# Patient Record
Sex: Female | Born: 1975 | Race: White | Hispanic: No | Marital: Single | Smoking: Never smoker
Health system: Southern US, Community
[De-identification: ages and names within clinical notes are randomized; demographics above are authoritative.]

## PROBLEM LIST (undated history)

## (undated) DIAGNOSIS — R51 Headache: Secondary | ICD-10-CM

## (undated) DIAGNOSIS — J45909 Unspecified asthma, uncomplicated: Secondary | ICD-10-CM

## (undated) DIAGNOSIS — J329 Chronic sinusitis, unspecified: Secondary | ICD-10-CM

## (undated) DIAGNOSIS — R519 Headache, unspecified: Secondary | ICD-10-CM

## (undated) HISTORY — PX: SINUS ENDO W/FUSION: SHX777

## (undated) HISTORY — DX: Unspecified asthma, uncomplicated: J45.909

## (undated) HISTORY — PX: REDUCTION MAMMAPLASTY: SUR839

---

## 2011-02-17 ENCOUNTER — Other Ambulatory Visit (HOSPITAL_COMMUNITY)
Admission: RE | Admit: 2011-02-17 | Discharge: 2011-02-17 | Disposition: A | Discharge status: Home / Self Care | Payer: Federal, State, Local not specified - PPO | Source: Ambulatory Visit | Attending: Family Medicine | Admitting: Family Medicine

## 2011-02-17 DIAGNOSIS — Z1159 Encounter for screening for other viral diseases: Secondary | ICD-10-CM | POA: Insufficient documentation

## 2011-02-17 DIAGNOSIS — Z124 Encounter for screening for malignant neoplasm of cervix: Secondary | ICD-10-CM | POA: Insufficient documentation

## 2013-08-27 ENCOUNTER — Ambulatory Visit (INDEPENDENT_AMBULATORY_CARE_PROVIDER_SITE_OTHER): Payer: Federal, State, Local not specified - PPO | Admitting: Podiatry

## 2013-08-27 ENCOUNTER — Encounter: Payer: Self-pay | Admitting: Podiatry

## 2013-08-27 VITALS — BP 123/77 | HR 72 | Resp 18

## 2013-08-27 DIAGNOSIS — L600 Ingrowing nail: Secondary | ICD-10-CM

## 2013-08-27 NOTE — Progress Notes (Signed)
   Subjective:    Patient ID: Daisy Steele, female    DOB: 09/08/1976, 36 y.o.   MRN: [id number]  HPI I have an ingrown toenail on my right big toe and throbbing and was swollen and red and I did pick at it and soaked it in peroxide and tender and sore and been going on for about 3 days before thanksgiving and the left big toenail is discolored and maybe ingrown as well  Patient recalls previous nail surgery to this area.  Review of Systems  Constitutional: Negative.   HENT: Positive for sinus pressure.   Eyes: Positive for itching.  Respiratory: Positive for wheezing.        Difficulty breathing  Cardiovascular: Negative.   Gastrointestinal: Negative.   Endocrine: Negative.   Genitourinary: Negative.   Musculoskeletal: Negative.   Skin:       Thick scars  Allergic/Immunologic: Positive for environmental allergies.  Neurological: Positive for numbness and headaches.  Hematological: Negative.   Psychiatric/Behavioral: Negative.        Objective:   Physical Exam  37 year old white female orientated x3  Vascular: The DP and PT pulses are two over four bilaterally.  Neurological: Sensation intact bilaterally  Dermatological: The lateral margin of the right hallux toenail is incurvated with low-grade erythema, edema with no drainage noted.         Assessment & Plan:   Assessment: Ingrowing lateral margin of the right hallux toenail.  Plan: Discussed treatment options including nonsurgical and surgical patient verbally consents to permanent nail surgery. The right hallux was then blocked with 3 cc of 50-50 mixture of 2% plain Xylocaine and 0.5% plain Marcaine. The right hallux is prepped with Betadine and exsanguinated. The lateral margin of the right hallux toenail was excised and a phenol matricectomy was performed. An antibiotic compression dressing was applied. The tourniquet was released and spontaneous capillary fill was noted to the right hallux. Postoperative oral and  written instructions provided. Acetaminophen recommended for pain control if needed. Reappoint at patient's request.

## 2013-08-27 NOTE — Patient Instructions (Signed)

## 2014-08-05 ENCOUNTER — Encounter: Payer: Self-pay | Admitting: Podiatry

## 2014-08-05 ENCOUNTER — Ambulatory Visit (INDEPENDENT_AMBULATORY_CARE_PROVIDER_SITE_OTHER): Payer: Federal, State, Local not specified - PPO | Admitting: Podiatry

## 2014-08-05 VITALS — BP 118/76 | HR 69 | Temp 97.9°F | Resp 13 | Ht 66.0 in | Wt 160.0 lb

## 2014-08-05 DIAGNOSIS — L600 Ingrowing nail: Secondary | ICD-10-CM

## 2014-08-05 NOTE — Patient Instructions (Signed)

## 2014-08-05 NOTE — Progress Notes (Signed)
   Subjective:    Patient ID: Daisy Steele, female    DOB: 04/27/1976, 38 y.o.   MRN: [id number]  HPI Comments: N ingrown toenail L left 1st lateral toenail D 5 days O C painful and red, and hx of bleeding A enclosed shoes and pressure T home surgery and cleanse with H2O2   She had a similar problem on the right hallux toenail which would resolve with phenol matricectomy.   Review of Systems  All other systems reviewed and are negative.      Objective:   Physical Exam Orientated 3  Vascular: DP and PT pulses 2/4 bilaterally  Neurological: Deferred  Dermatological: Lateral margin of left hallux toenail is incurvated with low-grade erythema and edema without drainage.  Musculoskeletal: HAV deformity left       Assessment & Plan:   Assessment: Satisfactory neurovascular status Ingrowing lateral margin of the left hallux toenail  Plan: Offered patient permanent nail removal to lateral margin left hallux toenail and she verbally consents.  The left hallux was blocked with 3 mL of 50-50 mixture of 2% plain Xylocaine and 0.5% plain Marcaine. The toe is prepped with Betadine and exsanguinated. The lateral margin the left hallux toenail was excised and a phenol matricectomy performed. An antibiotic compression dressing was applied. The tourniquet was released and spontaneous capillary fill time noted in the left hallux.  Patient tolerated procedure without any difficulty Postoperative oral reconstruction provided  Reappoint at patient's request

## 2015-04-23 ENCOUNTER — Encounter: Payer: Self-pay | Admitting: Podiatry

## 2015-04-23 ENCOUNTER — Ambulatory Visit (INDEPENDENT_AMBULATORY_CARE_PROVIDER_SITE_OTHER): Payer: Federal, State, Local not specified - PPO | Admitting: Podiatry

## 2015-04-23 VITALS — BP 105/62 | HR 78 | Resp 15

## 2015-04-23 DIAGNOSIS — M722 Plantar fascial fibromatosis: Secondary | ICD-10-CM

## 2015-04-23 NOTE — Patient Instructions (Signed)
Plantar Fasciitis (Heel Spur Syndrome) with Rehab The plantar fascia is a fibrous, ligament-like, soft-tissue structure that spans the bottom of the foot. Plantar fasciitis is a condition that causes pain in the foot due to inflammation of the tissue. SYMPTOMS   Pain and tenderness on the underneath side of the foot.  Pain that worsens with standing or walking. CAUSES  Plantar fasciitis is caused by irritation and injury to the plantar fascia on the underneath side of the foot. Common mechanisms of injury include:  Direct trauma to bottom of the foot.  Damage to a small nerve that runs under the foot where the main fascia attaches to the heel bone.  Stress placed on the plantar fascia due to bone spurs. RISK INCREASES WITH:   Activities that place stress on the plantar fascia (running, jumping, pivoting, or cutting).  Poor strength and flexibility.  Improperly fitted shoes.  Tight calf muscles.  Flat feet.  Failure to warm-up properly before activity.  Obesity. PREVENTION  Warm up and stretch properly before activity.  Allow for adequate recovery between workouts.  Maintain physical fitness:  Strength, flexibility, and endurance.  Cardiovascular fitness.  Maintain a health body weight.  Avoid stress on the plantar fascia.  Wear properly fitted shoes, including arch supports for individuals who have flat feet. PROGNOSIS  If treated properly, then the symptoms of plantar fasciitis usually resolve without surgery. However, occasionally surgery is necessary. RELATED COMPLICATIONS   Recurrent symptoms that may result in a chronic condition.  Problems of the lower back that are caused by compensating for the injury, such as limping.  Pain or weakness of the foot during push-off following surgery.  Chronic inflammation, scarring, and partial or complete fascia tear, occurring more often from repeated injections. TREATMENT  Treatment initially involves the use of  ice and medication to help reduce pain and inflammation. The use of strengthening and stretching exercises may help reduce pain with activity, especially stretches of the Achilles tendon. These exercises may be performed at home or with a therapist. Your caregiver may recommend that you use heel cups of arch supports to help reduce stress on the plantar fascia. Occasionally, corticosteroid injections are given to reduce inflammation. If symptoms persist for greater than 6 months despite non-surgical (conservative), then surgery may be recommended.  MEDICATION   If pain medication is necessary, then nonsteroidal anti-inflammatory medications, such as aspirin and ibuprofen, or other minor pain relievers, such as acetaminophen, are often recommended.  Do not take pain medication within 7 days before surgery.  Prescription pain relievers may be given if deemed necessary by your caregiver. Use only as directed and only as much as you need.  Corticosteroid injections may be given by your caregiver. These injections should be reserved for the most serious cases, because they may only be given a certain number of times. HEAT AND COLD  Cold treatment (icing) relieves pain and reduces inflammation. Cold treatment should be applied for 10 to 15 minutes every 2 to 3 hours for inflammation and pain and immediately after any activity that aggravates your symptoms. Use ice packs or massage the area with a piece of ice (ice massage).  Heat treatment may be used prior to performing the stretching and strengthening activities prescribed by your caregiver, physical therapist, or athletic trainer. Use a heat pack or soak the injury in warm water. SEEK IMMEDIATE MEDICAL CARE IF:  Treatment seems to offer no benefit, or the condition worsens.  Any medications produce adverse side effects. EXERCISES RANGE   OF MOTION (ROM) AND STRETCHING EXERCISES - Plantar Fasciitis (Heel Spur Syndrome) These exercises may help you  when beginning to rehabilitate your injury. Your symptoms may resolve with or without further involvement from your physician, physical therapist or athletic trainer. While completing these exercises, remember:   Restoring tissue flexibility helps normal motion to return to the joints. This allows healthier, less painful movement and activity.  An effective stretch should be held for at least 30 seconds.  A stretch should never be painful. You should only feel a gentle lengthening or release in the stretched tissue. RANGE OF MOTION - Toe Extension, Flexion  Sit with your right / left leg crossed over your opposite knee.  Grasp your toes and gently pull them back toward the top of your foot. You should feel a stretch on the bottom of your toes and/or foot.  Hold this stretch for __________ seconds.  Now, gently pull your toes toward the bottom of your foot. You should feel a stretch on the top of your toes and or foot.  Hold this stretch for __________ seconds. Repeat __________ times. Complete this stretch __________ times per day.  RANGE OF MOTION - Ankle Dorsiflexion, Active Assisted  Remove shoes and sit on a chair that is preferably not on a carpeted surface.  Place right / left foot under knee. Extend your opposite leg for support.  Keeping your heel down, slide your right / left foot back toward the chair until you feel a stretch at your ankle or calf. If you do not feel a stretch, slide your bottom forward to the edge of the chair, while still keeping your heel down.  Hold this stretch for __________ seconds. Repeat __________ times. Complete this stretch __________ times per day.  STRETCH - Gastroc, Standing  Place hands on wall.  Extend right / left leg, keeping the front knee somewhat bent.  Slightly point your toes inward on your back foot.  Keeping your right / left heel on the floor and your knee straight, shift your weight toward the wall, not allowing your back to  arch.  You should feel a gentle stretch in the right / left calf. Hold this position for __________ seconds. Repeat __________ times. Complete this stretch __________ times per day. STRETCH - Soleus, Standing  Place hands on wall.  Extend right / left leg, keeping the other knee somewhat bent.  Slightly point your toes inward on your back foot.  Keep your right / left heel on the floor, bend your back knee, and slightly shift your weight over the back leg so that you feel a gentle stretch deep in your back calf.  Hold this position for __________ seconds. Repeat __________ times. Complete this stretch __________ times per day. STRETCH - Gastrocsoleus, Standing  Note: This exercise can place a lot of stress on your foot and ankle. Please complete this exercise only if specifically instructed by your caregiver.   Place the ball of your right / left foot on a step, keeping your other foot firmly on the same step.  Hold on to the wall or a rail for balance.  Slowly lift your other foot, allowing your body weight to press your heel down over the edge of the step.  You should feel a stretch in your right / left calf.  Hold this position for __________ seconds.  Repeat this exercise with a slight bend in your right / left knee. Repeat __________ times. Complete this stretch __________ times per day.    STRENGTHENING EXERCISES - Plantar Fasciitis (Heel Spur Syndrome)  These exercises may help you when beginning to rehabilitate your injury. They may resolve your symptoms with or without further involvement from your physician, physical therapist or athletic trainer. While completing these exercises, remember:   Muscles can gain both the endurance and the strength needed for everyday activities through controlled exercises.  Complete these exercises as instructed by your physician, physical therapist or athletic trainer. Progress the resistance and repetitions only as guided. STRENGTH -  Towel Curls  Sit in a chair positioned on a non-carpeted surface.  Place your foot on a towel, keeping your heel on the floor.  Pull the towel toward your heel by only curling your toes. Keep your heel on the floor.  If instructed by your physician, physical therapist or athletic trainer, add ____________________ at the end of the towel. Repeat __________ times. Complete this exercise __________ times per day. STRENGTH - Ankle Inversion  Secure one end of a rubber exercise band/tubing to a fixed object (table, pole). Loop the other end around your foot just before your toes.  Place your fists between your knees. This will focus your strengthening at your ankle.  Slowly, pull your big toe up and in, making sure the band/tubing is positioned to resist the entire motion.  Hold this position for __________ seconds.  Have your muscles resist the band/tubing as it slowly pulls your foot back to the starting position. Repeat __________ times. Complete this exercises __________ times per day.  Document Released: 09/13/2005 Document Revised: 12/06/2011 Document Reviewed: 12/26/2008 [employer] Patient Information 2015 [employer]. This information is not intended to replace advice given to you by your health care provider. Make sure you discuss any questions you have with your health care provider.  

## 2015-04-23 NOTE — Progress Notes (Signed)
   Subjective:    Patient ID: Daisy Steele, female    DOB: 1975/12/30, 39 y.o.   MRN: [id number]  HPI This patient presents today complaining of right inferior heel pain noticeable on the first after the morning and when she changed from a seated seated to a standing position. Symptoms are increased with standing walking a relieved with rest. There is no previous history of this problem. Patient has taken occasional ibuprofen for this. Patient is a runner running up to 20 miles a week for multiple years. She denies pain when running or immediately after running, however, noticeable the following day in the a.m. She does change her running shoes out at least every 6 months.    Review of Systems  HENT: Positive for sinus pressure.        Objective:   Physical Exam  Orientated 3  Vascular: No peripheral edema noted bilaterally DP and PT pulses 2/4 bilaterally Capillary reflex media bilaterally  Neurological: Ankle reflex equal and reactive bilaterally Sensation to 10 g monofilament wire intact 5/5 bilaterally  Dermatological: Texture and turgor within normal limits  Musculoskeletal: Palpable tenderness medial plantar right inferior heel at the fascial insertional area without any palpable lesions. This area duplicates area of discomfort. HAV deformity right  X-ray examination today demonstrates HAV deformity with a small inferior calcaneal spur without any fracture and/or dislocation    Assessment & Plan:   Assessment: Satisfactory neurovascular status Plantar fasciitis right  Plan: I reviewed the results of examination x-ray with patient today in detail. I made patient aware that often plantar fasciitis last at 6-12 months I'm recommending that patient DC running and substitute exercise bike, rowing machine, swimming Shoeing and stretching discussed.  I offered patient Kenalog injection with the understanding that patient absolutely would not run for at least 1 week if at  all until symptoms resolve. Patient agrees not to run as described above and verbally consents to injection.  The skin is prepped with alcohol and Betadine and 10 mg of Kenalog mixed with 10 mg of plain Xylocaine and 2.5 mg of plain Marcaine were injected inferiorly he'll right for Kenalog injection #1. Patient tolerated procedure without any difficulty  After visit summary provided home physical therapy for plantar fasciitis  Reappoint at patient's request

## 2015-04-24 DIAGNOSIS — M722 Plantar fascial fibromatosis: Secondary | ICD-10-CM

## 2015-04-24 MED ORDER — TRIAMCINOLONE ACETONIDE 10 MG/ML IJ SUSP
10.0000 mg | Freq: Once | INTRAMUSCULAR | Status: AC
  Administered 2015-04-24: 10 mg

## 2015-04-29 ENCOUNTER — Telehealth: Payer: Self-pay

## 2015-04-29 ENCOUNTER — Ambulatory Visit (INDEPENDENT_AMBULATORY_CARE_PROVIDER_SITE_OTHER): Payer: Federal, State, Local not specified - PPO

## 2015-04-29 ENCOUNTER — Other Ambulatory Visit: Payer: Self-pay

## 2015-04-29 DIAGNOSIS — M722 Plantar fascial fibromatosis: Secondary | ICD-10-CM

## 2015-06-07 DIAGNOSIS — H1010 Acute atopic conjunctivitis, unspecified eye: Secondary | ICD-10-CM | POA: Insufficient documentation

## 2015-07-11 ENCOUNTER — Ambulatory Visit (INDEPENDENT_AMBULATORY_CARE_PROVIDER_SITE_OTHER): Payer: Federal, State, Local not specified - PPO | Admitting: Neurology

## 2015-07-11 DIAGNOSIS — J309 Allergic rhinitis, unspecified: Secondary | ICD-10-CM

## 2015-08-15 ENCOUNTER — Ambulatory Visit (INDEPENDENT_AMBULATORY_CARE_PROVIDER_SITE_OTHER): Payer: Federal, State, Local not specified - PPO | Admitting: *Deleted

## 2015-08-15 DIAGNOSIS — J309 Allergic rhinitis, unspecified: Secondary | ICD-10-CM | POA: Diagnosis not present

## 2015-09-11 ENCOUNTER — Ambulatory Visit (INDEPENDENT_AMBULATORY_CARE_PROVIDER_SITE_OTHER): Payer: Federal, State, Local not specified - PPO

## 2015-09-11 DIAGNOSIS — J309 Allergic rhinitis, unspecified: Secondary | ICD-10-CM | POA: Diagnosis not present

## 2015-09-17 ENCOUNTER — Ambulatory Visit (INDEPENDENT_AMBULATORY_CARE_PROVIDER_SITE_OTHER): Payer: Federal, State, Local not specified - PPO

## 2015-09-17 DIAGNOSIS — J309 Allergic rhinitis, unspecified: Secondary | ICD-10-CM | POA: Diagnosis not present

## 2015-09-25 ENCOUNTER — Ambulatory Visit (INDEPENDENT_AMBULATORY_CARE_PROVIDER_SITE_OTHER): Payer: Federal, State, Local not specified - PPO

## 2015-09-25 DIAGNOSIS — J309 Allergic rhinitis, unspecified: Secondary | ICD-10-CM

## 2015-10-02 ENCOUNTER — Ambulatory Visit (INDEPENDENT_AMBULATORY_CARE_PROVIDER_SITE_OTHER): Payer: Federal, State, Local not specified - PPO

## 2015-10-02 DIAGNOSIS — J309 Allergic rhinitis, unspecified: Secondary | ICD-10-CM | POA: Diagnosis not present

## 2015-10-10 ENCOUNTER — Ambulatory Visit (INDEPENDENT_AMBULATORY_CARE_PROVIDER_SITE_OTHER): Payer: Federal, State, Local not specified - PPO

## 2015-10-10 DIAGNOSIS — J309 Allergic rhinitis, unspecified: Secondary | ICD-10-CM | POA: Diagnosis not present

## 2015-11-21 ENCOUNTER — Ambulatory Visit (INDEPENDENT_AMBULATORY_CARE_PROVIDER_SITE_OTHER): Payer: Federal, State, Local not specified - PPO

## 2015-11-21 DIAGNOSIS — J309 Allergic rhinitis, unspecified: Secondary | ICD-10-CM | POA: Diagnosis not present

## 2015-12-22 ENCOUNTER — Ambulatory Visit (INDEPENDENT_AMBULATORY_CARE_PROVIDER_SITE_OTHER): Payer: Federal, State, Local not specified - PPO

## 2015-12-22 DIAGNOSIS — J309 Allergic rhinitis, unspecified: Secondary | ICD-10-CM | POA: Diagnosis not present

## 2016-01-06 DIAGNOSIS — J301 Allergic rhinitis due to pollen: Secondary | ICD-10-CM | POA: Diagnosis not present

## 2016-01-07 DIAGNOSIS — J3089 Other allergic rhinitis: Secondary | ICD-10-CM | POA: Diagnosis not present

## 2016-01-22 ENCOUNTER — Ambulatory Visit (INDEPENDENT_AMBULATORY_CARE_PROVIDER_SITE_OTHER): Payer: Federal, State, Local not specified - PPO

## 2016-01-22 DIAGNOSIS — J309 Allergic rhinitis, unspecified: Secondary | ICD-10-CM | POA: Diagnosis not present

## 2016-03-18 ENCOUNTER — Ambulatory Visit (INDEPENDENT_AMBULATORY_CARE_PROVIDER_SITE_OTHER): Payer: Federal, State, Local not specified - PPO

## 2016-03-18 DIAGNOSIS — J309 Allergic rhinitis, unspecified: Secondary | ICD-10-CM | POA: Diagnosis not present

## 2016-03-31 ENCOUNTER — Ambulatory Visit (INDEPENDENT_AMBULATORY_CARE_PROVIDER_SITE_OTHER): Payer: Federal, State, Local not specified - PPO

## 2016-03-31 DIAGNOSIS — J309 Allergic rhinitis, unspecified: Secondary | ICD-10-CM

## 2016-04-08 ENCOUNTER — Ambulatory Visit (INDEPENDENT_AMBULATORY_CARE_PROVIDER_SITE_OTHER): Payer: Federal, State, Local not specified - PPO

## 2016-04-08 DIAGNOSIS — J309 Allergic rhinitis, unspecified: Secondary | ICD-10-CM

## 2016-04-16 ENCOUNTER — Ambulatory Visit (INDEPENDENT_AMBULATORY_CARE_PROVIDER_SITE_OTHER): Payer: Federal, State, Local not specified - PPO

## 2016-04-16 DIAGNOSIS — J309 Allergic rhinitis, unspecified: Secondary | ICD-10-CM | POA: Diagnosis not present

## 2016-04-23 ENCOUNTER — Ambulatory Visit (INDEPENDENT_AMBULATORY_CARE_PROVIDER_SITE_OTHER): Payer: Federal, State, Local not specified - PPO | Admitting: *Deleted

## 2016-04-23 DIAGNOSIS — J309 Allergic rhinitis, unspecified: Secondary | ICD-10-CM | POA: Diagnosis not present

## 2016-05-17 DIAGNOSIS — J069 Acute upper respiratory infection, unspecified: Secondary | ICD-10-CM | POA: Diagnosis not present

## 2016-05-18 DIAGNOSIS — D485 Neoplasm of uncertain behavior of skin: Secondary | ICD-10-CM | POA: Diagnosis not present

## 2016-05-18 DIAGNOSIS — L570 Actinic keratosis: Secondary | ICD-10-CM | POA: Diagnosis not present

## 2016-06-01 DIAGNOSIS — R197 Diarrhea, unspecified: Secondary | ICD-10-CM | POA: Diagnosis not present

## 2016-06-01 DIAGNOSIS — J45909 Unspecified asthma, uncomplicated: Secondary | ICD-10-CM | POA: Diagnosis not present

## 2016-06-01 DIAGNOSIS — Z23 Encounter for immunization: Secondary | ICD-10-CM | POA: Diagnosis not present

## 2016-06-01 DIAGNOSIS — R152 Fecal urgency: Secondary | ICD-10-CM | POA: Diagnosis not present

## 2016-06-01 DIAGNOSIS — J309 Allergic rhinitis, unspecified: Secondary | ICD-10-CM | POA: Diagnosis not present

## 2016-06-07 DIAGNOSIS — R197 Diarrhea, unspecified: Secondary | ICD-10-CM | POA: Diagnosis not present

## 2016-06-14 DIAGNOSIS — R152 Fecal urgency: Secondary | ICD-10-CM | POA: Diagnosis not present

## 2016-06-14 DIAGNOSIS — R197 Diarrhea, unspecified: Secondary | ICD-10-CM | POA: Diagnosis not present

## 2016-06-28 DIAGNOSIS — K080 Exfoliation of teeth due to systemic causes: Secondary | ICD-10-CM | POA: Diagnosis not present

## 2016-07-08 ENCOUNTER — Encounter (INDEPENDENT_AMBULATORY_CARE_PROVIDER_SITE_OTHER): Payer: Self-pay

## 2016-07-08 ENCOUNTER — Ambulatory Visit (INDEPENDENT_AMBULATORY_CARE_PROVIDER_SITE_OTHER): Payer: Federal, State, Local not specified - PPO | Admitting: Allergy & Immunology

## 2016-07-08 ENCOUNTER — Encounter: Payer: Self-pay | Admitting: Allergy & Immunology

## 2016-07-08 VITALS — BP 110/80 | HR 78 | Resp 18 | Ht 64.5 in | Wt 173.2 lb

## 2016-07-08 DIAGNOSIS — K080 Exfoliation of teeth due to systemic causes: Secondary | ICD-10-CM | POA: Diagnosis not present

## 2016-07-08 DIAGNOSIS — J4520 Mild intermittent asthma, uncomplicated: Secondary | ICD-10-CM | POA: Diagnosis not present

## 2016-07-08 DIAGNOSIS — J3089 Other allergic rhinitis: Secondary | ICD-10-CM

## 2016-07-08 DIAGNOSIS — J019 Acute sinusitis, unspecified: Secondary | ICD-10-CM

## 2016-07-08 MED ORDER — AMOXICILLIN-POT CLAVULANATE 875-125 MG PO TABS: 1.0000 | ORAL_TABLET | Freq: Two times a day (BID) | ORAL | 0 refills | Status: DC

## 2016-07-08 MED ORDER — ALBUTEROL SULFATE 108 (90 BASE) MCG/ACT IN AEPB: 2.0000 | INHALATION_SPRAY | RESPIRATORY_TRACT | 1 refills | Status: DC | PRN

## 2016-07-08 NOTE — Progress Notes (Signed)
FOLLOW UP  Date of Service/Encounter:  07/08/16   Assessment:   Mild intermittent asthma, uncomplicated - Plan: Spirometry with Graph  Acute sinusitis, recurrence not specified, unspecified location  Perennial allergic rhinitis   Asthma Reportables:  Severity: intermittent  Risk: low Control: well controlled  Seasonal Influenza Vaccine: yes    Plan/Recommendations:   1. Mild intermittent asthma, uncomplicated - Start ProAir Respiclick 4 puffs every 4-6 hours as needed. - I feel that she likely needs a controller medication given her current symptoms. - Daisy Steele does not want to take daily medication for her asthma however. - I am unsure if she has insight into her symptoms and its connection with asthma. - I did ask her to monitor her symptoms and she is coughing and wheezing more than 1-2 times per week, I encouraged her to start her Qvar again. - Lung function was normal today.  2. Acute sinusitis, recurrence not specified, unspecified location - Start Augmentin 875mg  twice daily for 14 days. - Encouraged nasal saline rinses. - Encouraged her to continue with Flonase, although she gets bloody noses and headaches from this.  3. Perennial allergic rhinitis - She has effectively been off of her shots since July 2017 (around 3.5 years total since reaching maintenance). - She is very interested in stopping her shots completely. - We might need to restart if symptoms get worse. - Since we are making so many changes, I emphasized the need to see her on a more regular basis.   4. Return in about 3 months (around 10/08/2016).     Subjective:   Daisy Steele is a 40 y.o. female presenting today for follow up of  Chief Complaint  Patient presents with  . Allergic Rhinitis     PT HAS BEEN HAVING ALOT OF CONGESTION SINCE AUGUST. PT IS COUGHING IN THE DRAINAGE.   Marland Kitchen  Daisy Steele has a history of the following: Patient Active Problem List   Diagnosis Date Noted  .  Allergic conjunctivitis 06/07/2015    History obtained from: chart review and patient.  Daisy Steele was referred by [facility], PA-C.     Daisy Steele is a 40 y.o. female presenting for a sick visit. She reports that she is felling well aside from the chronic cough that has been there since August without improvement. When it started she did have nasal dripping but over the last few days she has felt somewhat drier. Currently she is taking Allegra only as well as cough medicine. She was using Mucinex for a few weeks. She also has a nasal steroid which did seem to work but she stopped after a couple of days due to bloody nose and headaches. She denies sinus pressure. She has had sinus infections in the past, she estimates that she gets them around 1-2 times per year. She does have a history of a sinus surgery when she was in the [employer]. This was complicated by the leaving of a cotton ball in the sinus during the surgery, for which she receives [place] benefits.   She remains on allergy shots but she has not had it since July. She is on the monthly schedule. She started the allergy shots in April 2013.and reached the red vial in November 2013. She estimates that she has been on them for three years. She has had continued eye involvement and takes Allegra daily. She has not tried weaning herself off of it.   Bently does have a history of asthma. She does have Qvar but she  does not ever use it. Her asthma has been well controlled without nighttime awakenings, ED visits, or prednisone bursts for her symptoms. Otherwise there've been no changes to her past medical history, surgical history, family history, or social history.    Review of Systems: a 14-point review of systems is pertinent for what is mentioned in HPI.  Otherwise, all other systems were negative. Constitutional: negative other than that listed in the HPI Eyes: negative other than that listed in the HPI Ears, nose, mouth, throat, and face:  negative other than that listed in the HPI Respiratory: negative other than that listed in the HPI Cardiovascular: negative other than that listed in the HPI Gastrointestinal: negative other than that listed in the HPI Genitourinary: negative other than that listed in the HPI Integument: negative other than that listed in the HPI Hematologic: negative other than that listed in the HPI Musculoskeletal: negative other than that listed in the HPI Neurological: negative other than that listed in the HPI Allergy/Immunologic: negative other than that listed in the HPI    Objective:   Blood pressure 110/80, pulse 78, resp. rate 18, height 5' 4.5" (1.638 m), weight 173 lb 3.2 oz (78.6 kg). Body mass index is 29.27 kg/m.   Physical Exam:  General: Alert, interactive, in no acute distress. Cooperative with the exam. Very friendly. HEENT: TMs pearly gray, turbinates edematous without discharge, post-pharynx mildly erythematous. Neck: Supple without thyromegaly. Adenopathy: no enlarged lymph nodes appreciated in the anterior cervical, occipital, axillary, epitrochlear, inguinal, or popliteal regions Lungs: Clear to auscultation without wheezing, rhonchi or rales. No increased work of breathing. CV: Normal S1/S2, no murmurs. Capillary refill <2 seconds.  Abdomen: Nondistended, nontender. No guarding or rebound tenderness. Bowel sounds faint  Skin: Warm and dry, without lesions or rashes. Extremities:  No clubbing, cyanosis or edema. Neuro:   Grossly intact.  Diagnostic studies:  Spirometry: results normal (FEV1: 2.52/89%, FVC: 3.01/89%, FEV1/FVC: 83%).    Spirometry consistent with normal pattern.   Allergy Studies: None    Malachi BondsJoel Kinley Dozier, MD [facility]

## 2016-07-08 NOTE — Patient Instructions (Addendum)
1. Mild intermittent asthma, uncomplicated - Start ProAir Respiclick 4 puffs every 4-6 hours as needed. - Monitor symptoms.  - If you are having increasing coughing and wheezing, you might need to restart the Qvar. - Lung function was normal today.  2. Acute sinusitis, recurrence not specified, unspecified location - Start Augmentin 875mg  twice daily for 14 days.  3. Perennial allergic rhinitis - Ok to stop shots today to see how you do. - We might need to restart if your symptoms get worse.  4. Return in about 3 months (around 10/08/2016).  Please inform us of any Emergency Department visits, hospitalizations, or changes in symptoms. Call us before going to the ED for breathing or allergy symptoms since we might be able to fit you in for a sick visit. Feel free to contact us anytime with any questions, problems, or concerns.  It was a pleasure to meet you today!   Websites that have reliable patient information: 1. American Academy of Asthma, Allergy, and Immunology: www.aaaai.org 2. Food Allergy Research and Education (FARE): foodallergy.org 3. Mothers of Asthmatics: http://www.asthmacommunitynetwork.org 4. American College of Allergy, Asthma, and Immunology: www.acaai.org

## 2016-07-22 DIAGNOSIS — L570 Actinic keratosis: Secondary | ICD-10-CM | POA: Diagnosis not present

## 2016-08-18 DIAGNOSIS — D2262 Melanocytic nevi of left upper limb, including shoulder: Secondary | ICD-10-CM | POA: Diagnosis not present

## 2016-08-18 DIAGNOSIS — D2272 Melanocytic nevi of left lower limb, including hip: Secondary | ICD-10-CM | POA: Diagnosis not present

## 2016-08-18 DIAGNOSIS — L821 Other seborrheic keratosis: Secondary | ICD-10-CM | POA: Diagnosis not present

## 2016-08-18 DIAGNOSIS — D224 Melanocytic nevi of scalp and neck: Secondary | ICD-10-CM | POA: Diagnosis not present

## 2016-10-11 ENCOUNTER — Ambulatory Visit (INDEPENDENT_AMBULATORY_CARE_PROVIDER_SITE_OTHER): Payer: Federal, State, Local not specified - PPO | Admitting: Allergy & Immunology

## 2016-10-11 ENCOUNTER — Encounter: Payer: Self-pay | Admitting: Allergy & Immunology

## 2016-10-11 VITALS — BP 110/76 | HR 82 | Temp 97.9°F | Resp 18

## 2016-10-11 DIAGNOSIS — J3089 Other allergic rhinitis: Secondary | ICD-10-CM | POA: Diagnosis not present

## 2016-10-11 DIAGNOSIS — J4520 Mild intermittent asthma, uncomplicated: Secondary | ICD-10-CM | POA: Diagnosis not present

## 2016-10-11 MED ORDER — BEPOTASTINE BESILATE 1.5 % OP SOLN: OPHTHALMIC | 5 refills | Status: AC

## 2016-10-11 NOTE — Progress Notes (Signed)
FOLLOW UP  Date of Service/Encounter:  10/11/16   Assessment:   Mild intermittent asthma, uncomplicated - Plan: Spirometry with Graph  Perennial allergic rhinitis - s/p 3.5 years of immunotherapy   Asthma Reportables:  Severity: intermittent  Risk: low Control: well controlled  Seasonal Influenza Vaccine: yes    Plan/Recommendations:   1. Mild intermittent asthma, uncomplicated - Lung testing looked good today. - Continue with ProAir as needed.   - There does not appear to be a controller medication indicated at this time.   2. Perennial allergic rhinitis with conjunctivitis - Sample of Bepreve provided today and prescription sent in.  - It is odd that the symptoms worsened with the cold weather, which makes me think that irritation from the dry air is more related to the symptoms rather than an allergic triggers.  - I recommended using over the counter lubricating drops to help with your eyes as well. - Use nasal saline to help with the nose bleeds and keep your nasal cavity moist.   3. Return in about 6 months (around 04/10/2017).   Subjective:   Daisy Steele is a 41 y.o. female presenting today for follow up of  Chief Complaint  Patient presents with  . Nasal Congestion    Mild Congestion with nose bleeds/not using any saline.  . Allergic Rhinitis     Dry eyes and itching  . Asthma    Doing better.  No recent flares    Daisy Steele has a history of the following: Patient Active Problem List   Diagnosis Date Noted  . Perennial allergic rhinitis 10/11/2016  . Mild intermittent asthma, uncomplicated 10/11/2016  . Allergic conjunctivitis 06/07/2015    History obtained from: chart review and patient.  Daisy Daisy Steele was [facility], [facility].     Daisy Steele is a 41 y.o. female presenting for a follow up visit. The patient was last seen in October 2017. At that time, we changed her to [facility] puffs as needed. She did not want to take  daily medications. Her lung function was normal. She was treated with Augmentin for acute sinusitis. I recommended taking Flonase, but she gets bloody noses and headaches from this medication. She is status post 3-1/2 years of maintenance on her allergy shots, with her last injection in July 2017.  Since last visit, she has done very well. Her last ProAir was two months ago. Daisy Steele's asthma has been well controlled. She has not required rescue medication, experienced nocturnal awakenings due to lower respiratory symptoms, nor have activities of daily living been limited. She has not restarted Qvar at this time.  Daisy Steele reports itchy dry eyes throughout the day. She was on olopatadine but did not think that it worked. Daisy RedheadBepreve has worked in the past. She reports worsening symptoms with the start of the winter. She does have allergy filters for her HVAC system. She does endorse eye itchiness that has not really worsened since stopping the antihistamines daily.  Otherwise, there have been no changes to her past medical history, surgical history, family history, or social history. [place]. 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     Review of Systems: a 14-point review of systems is pertinent for what is mentioned in HPI.  Otherwise, all other systems were negative. Constitutional: negative other than that listed in the HPI Eyes: negative other than that listed  in the HPI Ears, nose, mouth, throat, and face: negative other than that listed in the HPI Respiratory: negative other than that listed in the HPI Cardiovascular: negative other than that listed in the HPI Gastrointestinal: negative other than that listed in the HPI Genitourinary: negative other than that listed in the HPI Integument: negative other than that listed in the HPI Hematologic: negative other than that listed in the  HPI Musculoskeletal: negative other than that listed in the HPI Neurological: negative other than that listed in the HPI Allergy/Immunologic: negative other than that listed in the HPI    Objective:   Blood pressure 110/76, pulse 82, temperature 97.9 F (36.6 C), temperature source Oral, resp. rate 18, SpO2 98 %. There is no height or weight on file to calculate BMI.   Physical Exam:  General: Alert, interactive, in no acute distress. Pleasant female.  Eyes: No conjunctival injection present on the right, No conjunctival injection present on the left, PERRL bilaterally, No discharge on the right, No discharge on the left and No Horner-Trantas dots present Ears: Right TM pearly gray with normal light reflex, Left TM pearly gray with normal light reflex, Right TM intact without perforation and Left TM intact without perforation.  Nose/Throat: External nose within normal limits and septum midline, turbinates edematous and pale with clear discharge, post-pharynx mildly erythematous without cobblestoning in the posterior oropharynx. Tonsils 2+ without exudates Neck: Supple without thyromegaly. Lungs: Clear to auscultation without wheezing, rhonchi or rales. No increased work of breathing. CV: Normal S1/S2, no murmurs. Capillary refill <2 seconds.  Skin: Warm and dry, without lesions or rashes. Neuro:   Grossly intact. No focal deficits appreciated. Responsive to questions.   Diagnostic studies:  Spirometry: results normal (FEV1: 2.41/85%, FVC: 2.87/85%, FEV1/FVC: 84%).    Spirometry consistent with normal pattern.   Allergy Studies: none     Daisy Bonds, MD [facility]

## 2016-10-11 NOTE — Patient Instructions (Signed)
1. Mild intermittent asthma, uncomplicated - Lung testing looked good today. - Continue with ProAir as needed.   2. Perennial allergic rhinitis - Sample of Bepreve provided today. - We will send in a prescription to see what the coverage is likely.  - Try using over the counter lubricating drops to help with your eyes as well. - Use nasal saline to help with the nose bleeds and keep your nasal cavity moist.   3. Return in about 6 months (around 04/10/2017).  Please inform us of any Emergency Department visits, hospitalizations, or changes in symptoms. Call us before going to the ED for breathing or allergy symptoms since we might be able to fit you in for a sick visit. Feel free to contact us anytime with any questions, problems, or concerns.  It was a pleasure to see you again today! Best wishes in the [place]!   Websites that have reliable patient information: 1. American Academy of Asthma, Allergy, and Immunology: www.aaaai.org 2. Food Allergy Research and Education (FARE): foodallergy.org 3. Mothers of Asthmatics: http://www.asthmacommunitynetwork.org 4. American College of Allergy, Asthma, and Immunology: www.acaai.org

## 2016-12-15 ENCOUNTER — Other Ambulatory Visit: Payer: Self-pay | Admitting: Family Medicine

## 2016-12-15 ENCOUNTER — Other Ambulatory Visit (HOSPITAL_COMMUNITY)
Admission: RE | Admit: 2016-12-15 | Discharge: 2016-12-15 | Disposition: A | Discharge status: Home / Self Care | Payer: Federal, State, Local not specified - PPO | Source: Ambulatory Visit | Attending: Family Medicine | Admitting: Family Medicine

## 2016-12-15 DIAGNOSIS — Z1151 Encounter for screening for human papillomavirus (HPV): Secondary | ICD-10-CM | POA: Insufficient documentation

## 2016-12-15 DIAGNOSIS — Z0000 Encounter for general adult medical examination without abnormal findings: Secondary | ICD-10-CM | POA: Diagnosis not present

## 2016-12-15 DIAGNOSIS — E7800 Pure hypercholesterolemia, unspecified: Secondary | ICD-10-CM | POA: Diagnosis not present

## 2016-12-15 DIAGNOSIS — Z124 Encounter for screening for malignant neoplasm of cervix: Secondary | ICD-10-CM | POA: Insufficient documentation

## 2016-12-16 ENCOUNTER — Other Ambulatory Visit: Payer: Self-pay | Admitting: Family Medicine

## 2016-12-16 DIAGNOSIS — Z1231 Encounter for screening mammogram for malignant neoplasm of breast: Secondary | ICD-10-CM

## 2016-12-17 LAB — CYTOLOGY - PAP
Diagnosis: UNDETERMINED — AB
HPV: NOT DETECTED

## 2017-01-05 ENCOUNTER — Ambulatory Visit
Admission: RE | Admit: 2017-01-05 | Discharge: 2017-01-05 | Disposition: A | Discharge status: Home / Self Care | Payer: Federal, State, Local not specified - PPO | Source: Ambulatory Visit | Attending: Family Medicine | Admitting: Family Medicine

## 2017-01-05 DIAGNOSIS — Z1231 Encounter for screening mammogram for malignant neoplasm of breast: Secondary | ICD-10-CM

## 2017-01-21 DIAGNOSIS — K080 Exfoliation of teeth due to systemic causes: Secondary | ICD-10-CM | POA: Diagnosis not present

## 2017-03-19 DIAGNOSIS — S90861A Insect bite (nonvenomous), right foot, initial encounter: Secondary | ICD-10-CM | POA: Diagnosis not present

## 2017-04-06 DIAGNOSIS — M7652 Patellar tendinitis, left knee: Secondary | ICD-10-CM | POA: Diagnosis not present

## 2017-04-06 DIAGNOSIS — S8001XA Contusion of right knee, initial encounter: Secondary | ICD-10-CM | POA: Diagnosis not present

## 2017-06-17 NOTE — Telephone Encounter (Signed)
error 

## 2017-08-04 DIAGNOSIS — K080 Exfoliation of teeth due to systemic causes: Secondary | ICD-10-CM | POA: Diagnosis not present

## 2017-08-17 DIAGNOSIS — D2262 Melanocytic nevi of left upper limb, including shoulder: Secondary | ICD-10-CM | POA: Diagnosis not present

## 2017-08-17 DIAGNOSIS — D224 Melanocytic nevi of scalp and neck: Secondary | ICD-10-CM | POA: Diagnosis not present

## 2017-08-17 DIAGNOSIS — L821 Other seborrheic keratosis: Secondary | ICD-10-CM | POA: Diagnosis not present

## 2017-08-17 DIAGNOSIS — D2272 Melanocytic nevi of left lower limb, including hip: Secondary | ICD-10-CM | POA: Diagnosis not present

## 2017-11-15 IMAGING — MG 2D DIGITAL SCREENING BILATERAL MAMMOGRAM WITH CAD AND ADJUNCT TO
8 of 13 series · 8 of 29 positions shown · non-contrast
Comparison: None.

CLINICAL DATA: Screening.

EXAM:
2D DIGITAL SCREENING BILATERAL MAMMOGRAM WITH CAD AND ADJUNCT TOMO

[L MLO (1 of 2)]
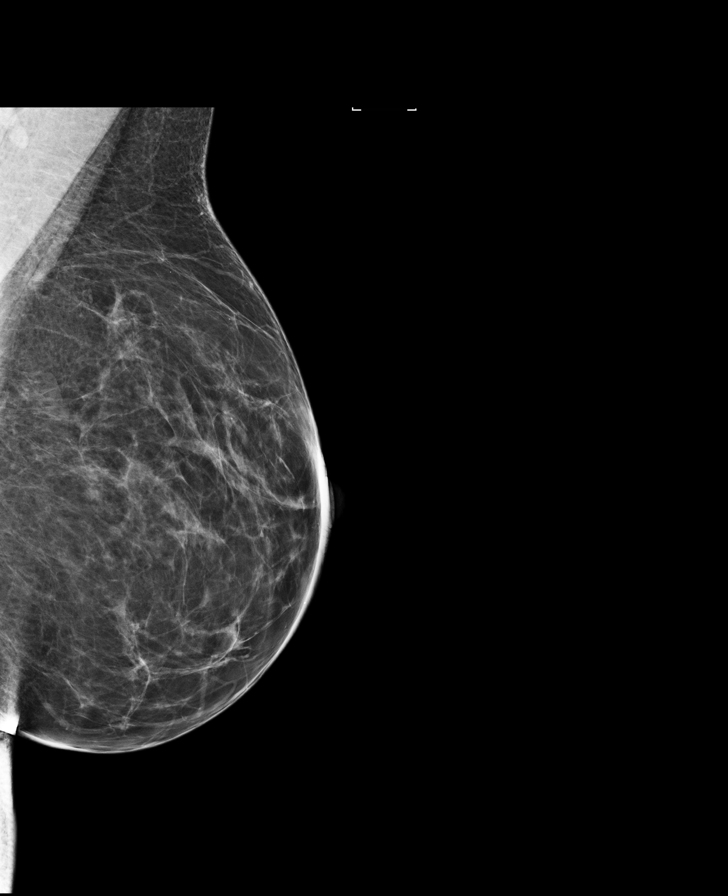

[L CC]
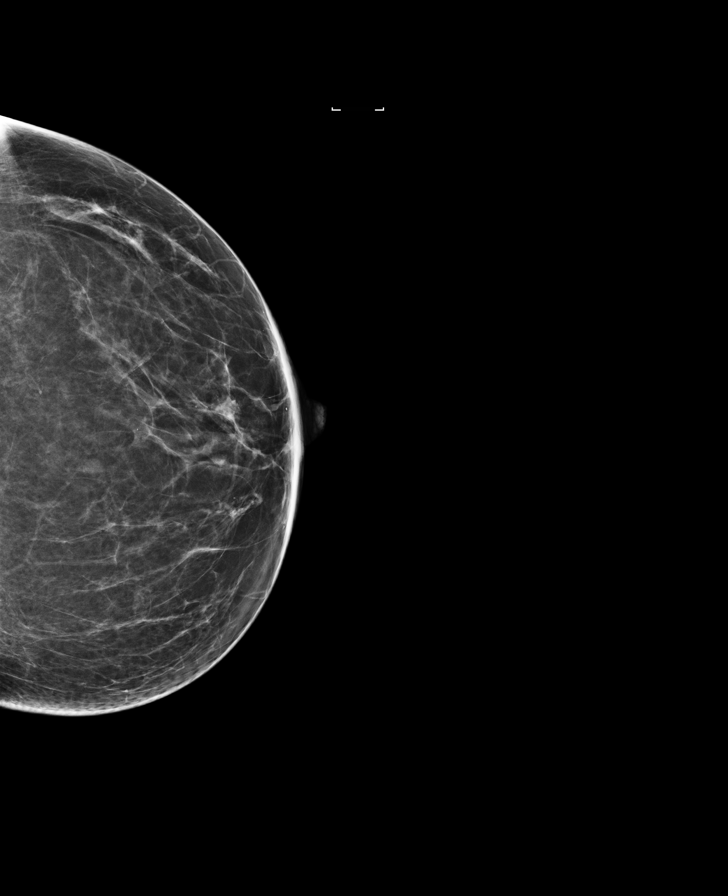

[R MLO]
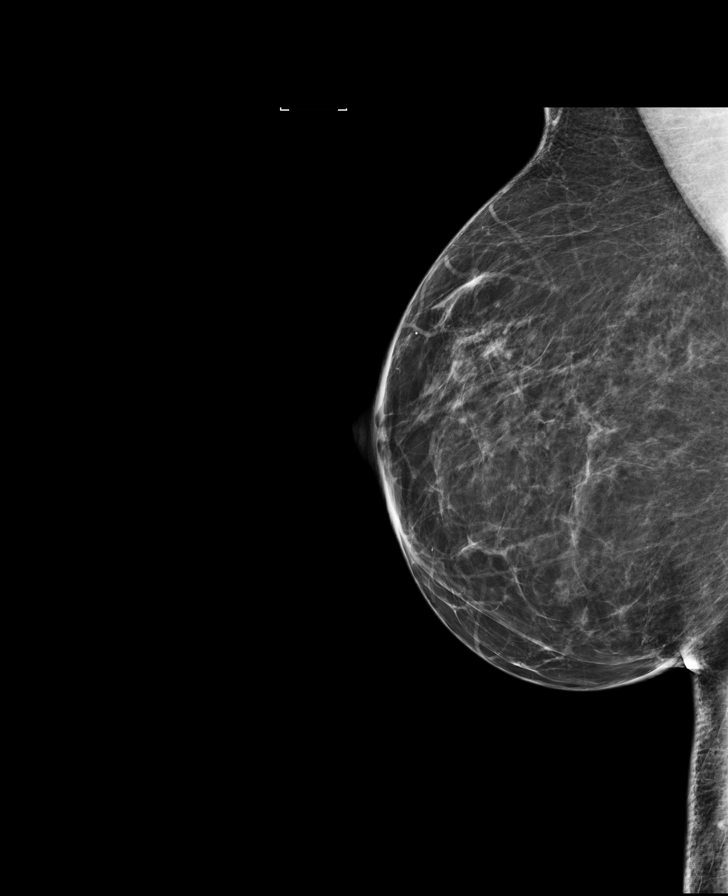

[L MLO synth-2D]
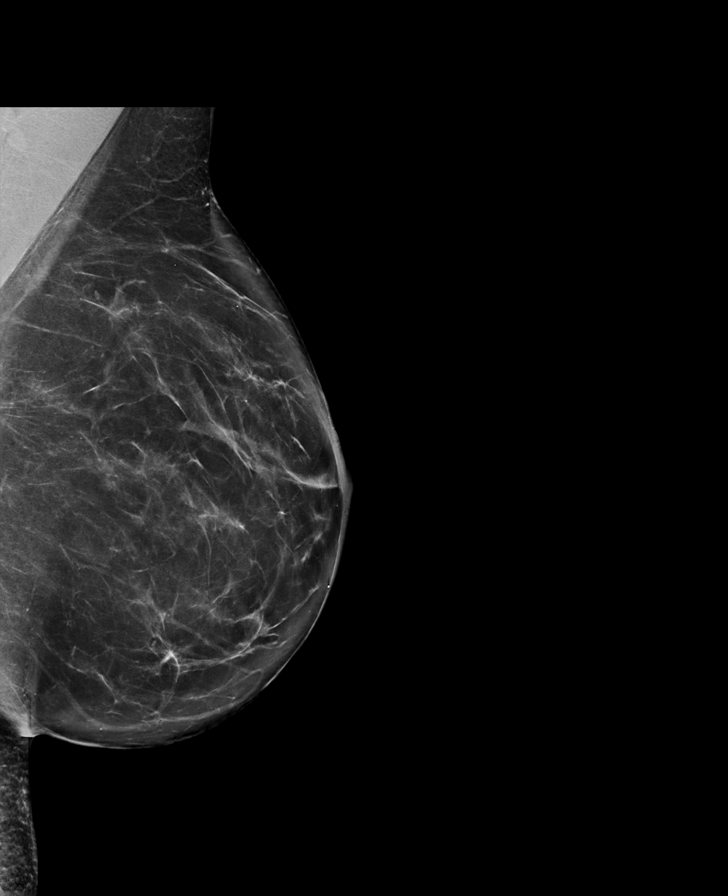

[R CC synth-2D]
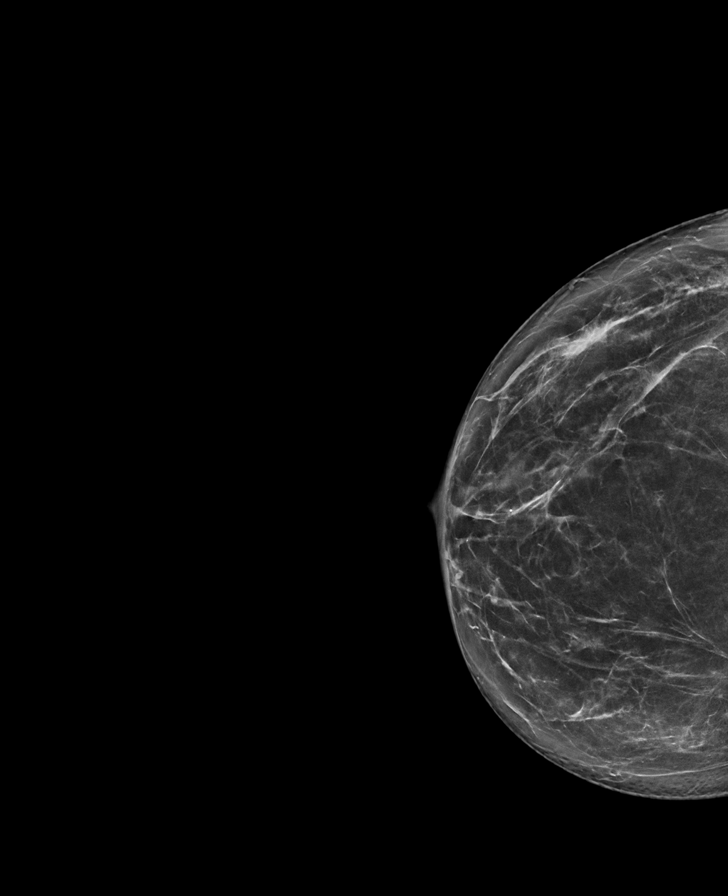

[L MLO (2 of 2)]
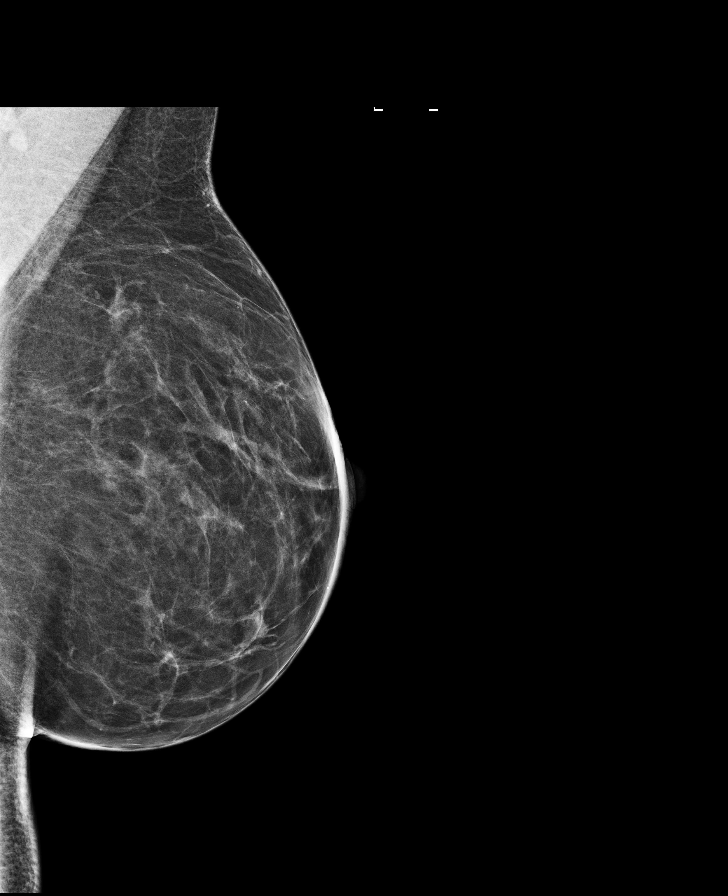

[R CC]
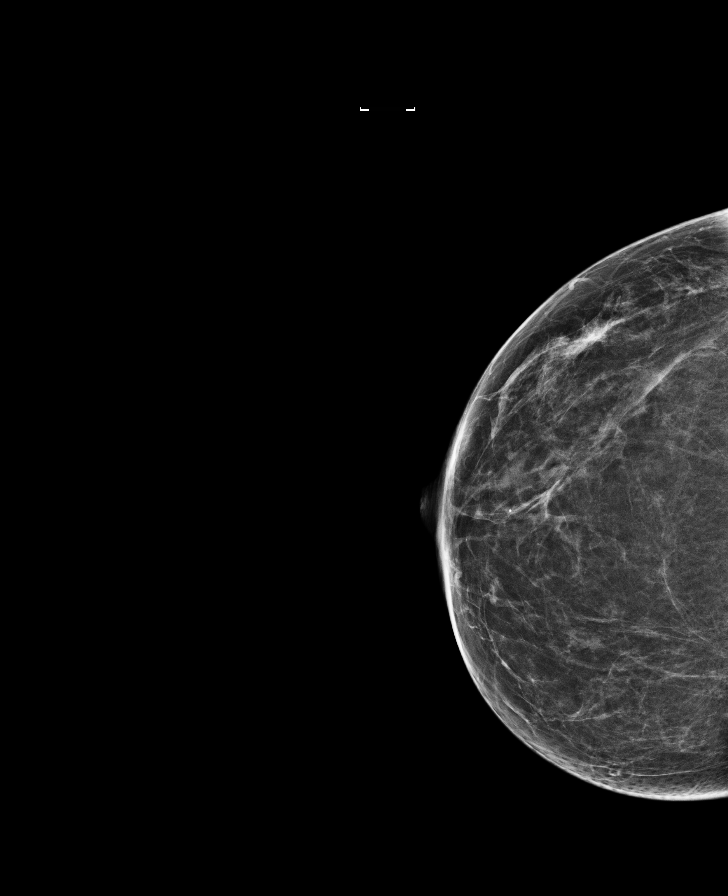

[L CC synth-2D]
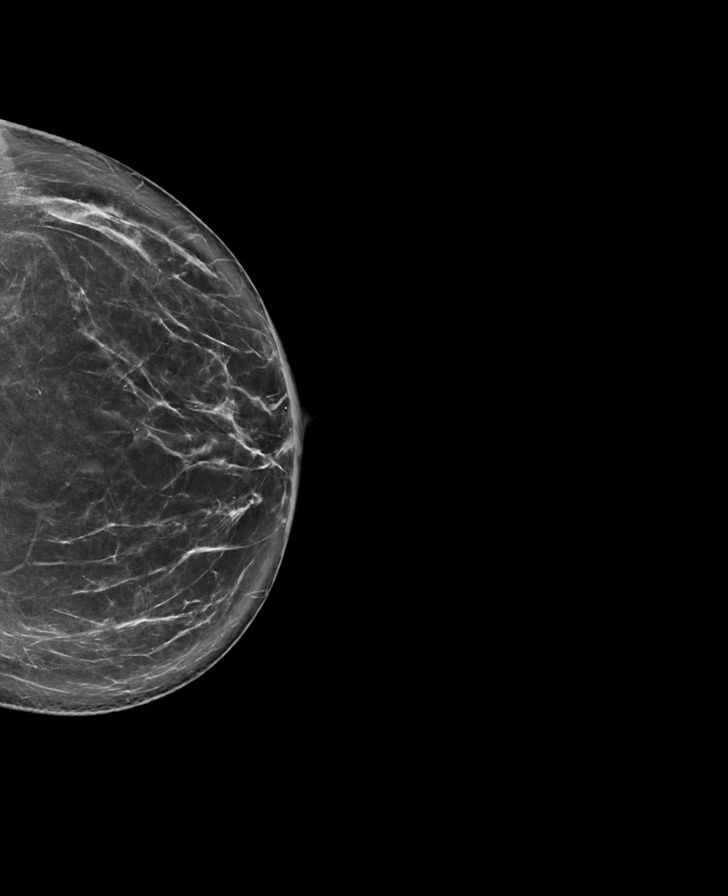

[8 of 29 positions shown; findings below may reference images not displayed]

ACR Breast Density Category b: There are scattered areas of
fibroglandular density.
FINDINGS: There are no findings suspicious for malignancy. Images were
processed with CAD.
IMPRESSION: No mammographic evidence of malignancy. A result letter of this
screening mammogram will be mailed directly to the patient.

RECOMMENDATION:
Screening mammogram in one year. (Code:[id number]-M-3AZ)

BI-RADS CATEGORY  1: Negative.

## 2017-11-23 DIAGNOSIS — J0100 Acute maxillary sinusitis, unspecified: Secondary | ICD-10-CM | POA: Diagnosis not present

## 2017-12-05 DIAGNOSIS — R6884 Jaw pain: Secondary | ICD-10-CM | POA: Diagnosis not present

## 2017-12-05 DIAGNOSIS — J3489 Other specified disorders of nose and nasal sinuses: Secondary | ICD-10-CM | POA: Diagnosis not present

## 2017-12-05 DIAGNOSIS — K080 Exfoliation of teeth due to systemic causes: Secondary | ICD-10-CM | POA: Diagnosis not present

## 2017-12-12 ENCOUNTER — Other Ambulatory Visit: Payer: Self-pay | Admitting: Family Medicine

## 2017-12-12 DIAGNOSIS — Z1231 Encounter for screening mammogram for malignant neoplasm of breast: Secondary | ICD-10-CM

## 2017-12-19 DIAGNOSIS — J3489 Other specified disorders of nose and nasal sinuses: Secondary | ICD-10-CM | POA: Diagnosis not present

## 2017-12-19 DIAGNOSIS — J320 Chronic maxillary sinusitis: Secondary | ICD-10-CM | POA: Diagnosis not present

## 2017-12-22 ENCOUNTER — Other Ambulatory Visit (HOSPITAL_COMMUNITY)
Admission: RE | Admit: 2017-12-22 | Discharge: 2017-12-22 | Disposition: A | Discharge status: Home / Self Care | Payer: Federal, State, Local not specified - PPO | Source: Ambulatory Visit | Attending: Family Medicine | Admitting: Family Medicine

## 2017-12-22 ENCOUNTER — Other Ambulatory Visit: Payer: Self-pay | Admitting: Family Medicine

## 2017-12-22 DIAGNOSIS — Z124 Encounter for screening for malignant neoplasm of cervix: Secondary | ICD-10-CM | POA: Diagnosis not present

## 2017-12-22 DIAGNOSIS — J309 Allergic rhinitis, unspecified: Secondary | ICD-10-CM | POA: Diagnosis not present

## 2017-12-22 DIAGNOSIS — Z01419 Encounter for gynecological examination (general) (routine) without abnormal findings: Secondary | ICD-10-CM | POA: Diagnosis not present

## 2017-12-22 DIAGNOSIS — Z0000 Encounter for general adult medical examination without abnormal findings: Secondary | ICD-10-CM | POA: Diagnosis not present

## 2017-12-22 DIAGNOSIS — E7800 Pure hypercholesterolemia, unspecified: Secondary | ICD-10-CM | POA: Diagnosis not present

## 2017-12-22 DIAGNOSIS — G43109 Migraine with aura, not intractable, without status migrainosus: Secondary | ICD-10-CM | POA: Diagnosis not present

## 2017-12-26 LAB — CYTOLOGY - PAP
Diagnosis: NEGATIVE
HPV: NOT DETECTED

## 2017-12-30 ENCOUNTER — Other Ambulatory Visit: Payer: Self-pay

## 2017-12-30 ENCOUNTER — Encounter (HOSPITAL_BASED_OUTPATIENT_CLINIC_OR_DEPARTMENT_OTHER): Payer: Self-pay | Admitting: *Deleted

## 2018-01-03 NOTE — H&P (Signed)
HPI:   Daisy Steele is a 42 y.o. female who presents as a consult Patient.   Referring Provider: Adrian Saran*  Chief complaint: Sinus pain.  HPI: One month history of right maxillary and maxillary dental pain. She denies any nasal symptoms or any other associated symptoms. She had the same thing about 10 years ago while in the [employer] and ended up having a Elijah Birk luck procedure with AP to me on that same side. She has not had problems since until this past month.  PMH/Meds/All/SocHx/FamHx/ROS:   History reviewed. No pertinent past medical history.  Past Surgical History:  Procedure Laterality Date  . MOUTH SURGERY  . SINUS SURGERY   No family history of bleeding disorders, wound healing problems or difficulty with anesthesia.   Social History   Social History  . Marital status: N/A  Spouse name: N/A  . Number of children: N/A  . Years of education: N/A   Occupational History  . Not on file.   Social History Main Topics  . Smoking status: Never Smoker  . Smokeless tobacco: Never Used  . Alcohol use Not on file  . Drug use: Unknown  . Sexual activity: Not on file   Other Topics Concern  . Not on file   Social History Narrative  . No narrative on file   Current Outpatient Prescriptions:  . albuterol (PROAIR HFA) 90 mcg/actuation inhaler, Inhale into the lungs., Disp: , Rfl:  . SUMAtriptan (IMITREX) 50 MG tablet, , Disp: , Rfl:  . EPINEPHrine (EPIPEN 2-PAK) 0.3 mg/0.3 mL auto-injector, Inject into the muscle., Disp: , Rfl:   A complete ROS was performed with pertinent positives/negatives noted in the HPI. The remainder of the ROS are negative.   Physical Exam:   Ht 1.651 m (5\' 5" )  Wt 76.2 kg (168 lb)  BMI 27.96 kg/m   General: Healthy and alert, in no distress, breathing easily. Normal affect. In a pleasant mood. Head: Normocephalic, atraumatic. No masses, or scars. Eyes: Pupils are equal, and reactive to light. Vision is grossly intact. No  spontaneous or gaze nystagmus. Ears: Ear canals are clear. Tympanic membranes are intact, with normal landmarks and the middle ears are clear and healthy. Hearing: Grossly normal. Nose: Nasal cavities are clear with healthy mucosa, no polyps or exudate. Airways are patent. Face: No masses or scars, facial nerve function is symmetric. Oral Cavity: No mucosal abnormalities are noted. Tongue with normal mobility. Dentition appears healthy, with extensive restorative work done in the right maxilla but no tenderness to percussion. Oropharynx: Tonsils are symmetric. There are no mucosal masses identified. Tongue base appears normal and healthy. Larynx/Hypopharynx: deferred Chest: Deferred Neck: No palpable masses, no cervical adenopathy, no thyroid nodules or enlargement. Neuro: Cranial nerves II-XII with normal function. Balance: Normal gate. Other findings: none.  Independent Review of Additional Tests or Records:  CT reveals all sinuses are clear except for the inferior aspect of the right maxillary sinus. There is partial opacification. The outflow tract is open and clear. There may be some soft tissue density around a tooth root, the second molar. There is also evidence of what I believe is a root canal.  Procedures:  none  Impression & Plans:  Right maxillary pain for 1 month. Similar symptoms 15 years ago requiring sinus surgery. Recommend CT scan of sinuses to evaluate the sinuses and the maxillary dentition. This will help determine if this is a sinus or dental issue.  Based on the CT findings, it is not entirely clear  if this is a primary sinus or dental issue. If it is a sinus issue and surgery is indicated it will need to be a Caldwell-luc approach again. I would like her to see her dentist and possibly an endodontist first to rule out a dental origin. Follow-up after that if necessary.

## 2018-01-09 ENCOUNTER — Other Ambulatory Visit: Payer: Self-pay

## 2018-01-09 ENCOUNTER — Ambulatory Visit (HOSPITAL_BASED_OUTPATIENT_CLINIC_OR_DEPARTMENT_OTHER): Payer: Federal, State, Local not specified - PPO | Admitting: Anesthesiology

## 2018-01-09 ENCOUNTER — Encounter (HOSPITAL_BASED_OUTPATIENT_CLINIC_OR_DEPARTMENT_OTHER)
Admission: RE | Disposition: A | Discharge status: Home / Self Care | Payer: Self-pay | Source: Ambulatory Visit | Attending: Otolaryngology

## 2018-01-09 ENCOUNTER — Encounter (HOSPITAL_BASED_OUTPATIENT_CLINIC_OR_DEPARTMENT_OTHER): Payer: Self-pay

## 2018-01-09 ENCOUNTER — Ambulatory Visit (HOSPITAL_BASED_OUTPATIENT_CLINIC_OR_DEPARTMENT_OTHER)
Admission: RE | Admit: 2018-01-09 | Discharge: 2018-01-09 | Disposition: A | Discharge status: Home / Self Care | Payer: Federal, State, Local not specified - PPO | Source: Ambulatory Visit | Attending: Otolaryngology | Admitting: Otolaryngology

## 2018-01-09 DIAGNOSIS — J3089 Other allergic rhinitis: Secondary | ICD-10-CM | POA: Diagnosis not present

## 2018-01-09 DIAGNOSIS — J4520 Mild intermittent asthma, uncomplicated: Secondary | ICD-10-CM | POA: Diagnosis not present

## 2018-01-09 DIAGNOSIS — H1010 Acute atopic conjunctivitis, unspecified eye: Secondary | ICD-10-CM | POA: Diagnosis not present

## 2018-01-09 DIAGNOSIS — J320 Chronic maxillary sinusitis: Secondary | ICD-10-CM | POA: Diagnosis not present

## 2018-01-09 DIAGNOSIS — J3489 Other specified disorders of nose and nasal sinuses: Secondary | ICD-10-CM | POA: Diagnosis not present

## 2018-01-09 DIAGNOSIS — J45909 Unspecified asthma, uncomplicated: Secondary | ICD-10-CM | POA: Diagnosis not present

## 2018-01-09 HISTORY — DX: Headache: R51

## 2018-01-09 HISTORY — DX: Chronic sinusitis, unspecified: J32.9

## 2018-01-09 HISTORY — DX: Headache, unspecified: R51.9

## 2018-01-09 HISTORY — PX: SINUSOTOMY: SHX291

## 2018-01-09 SURGERY — CALDWELL-LUC PROCEDURE
Anesthesia: General | Site: Mouth | Laterality: Right

## 2018-01-09 MED ORDER — LIDOCAINE-EPINEPHRINE 1 %-1:100000 IJ SOLN
INTRAMUSCULAR | Status: AC
  Filled 2018-01-09: qty 1

## 2018-01-09 MED ORDER — PROPOFOL 10 MG/ML IV BOLUS
INTRAVENOUS | Status: AC
  Filled 2018-01-09: qty 20

## 2018-01-09 MED ORDER — OXYCODONE HCL 5 MG/5ML PO SOLN: 5.0000 mg | Freq: Once | ORAL | Status: DC | PRN

## 2018-01-09 MED ORDER — OXYMETAZOLINE HCL 0.05 % NA SOLN
2.0000 | NASAL | Status: DC
  Administered 2018-01-09: 2 via NASAL

## 2018-01-09 MED ORDER — HYDROCODONE-ACETAMINOPHEN 7.5-325 MG PO TABS: 1.0000 | ORAL_TABLET | Freq: Four times a day (QID) | ORAL | 0 refills | Status: AC | PRN

## 2018-01-09 MED ORDER — MEPERIDINE HCL 25 MG/ML IJ SOLN
6.2500 mg | INTRAMUSCULAR | Status: DC | PRN
Start: 2018-01-09 — End: 2018-01-09

## 2018-01-09 MED ORDER — DEXAMETHASONE SODIUM PHOSPHATE 10 MG/ML IJ SOLN
INTRAMUSCULAR | Status: AC
  Filled 2018-01-09: qty 1

## 2018-01-09 MED ORDER — HYDROMORPHONE HCL 1 MG/ML IJ SOLN: 0.2500 mg | INTRAMUSCULAR | Status: DC | PRN

## 2018-01-09 MED ORDER — FENTANYL CITRATE (PF) 100 MCG/2ML IJ SOLN
50.0000 ug | INTRAMUSCULAR | Status: DC | PRN
  Administered 2018-01-09: 100 ug via INTRAVENOUS
  Administered 2018-01-09: 50 ug via INTRAVENOUS

## 2018-01-09 MED ORDER — FENTANYL CITRATE (PF) 100 MCG/2ML IJ SOLN
INTRAMUSCULAR | Status: AC
  Filled 2018-01-09: qty 2

## 2018-01-09 MED ORDER — SCOPOLAMINE 1 MG/3DAYS TD PT72: 1.0000 | MEDICATED_PATCH | Freq: Once | TRANSDERMAL | Status: DC | PRN

## 2018-01-09 MED ORDER — OXYMETAZOLINE HCL 0.05 % NA SOLN
NASAL | Status: AC
  Filled 2018-01-09: qty 15

## 2018-01-09 MED ORDER — LIDOCAINE 2% (20 MG/ML) 5 ML SYRINGE
INTRAMUSCULAR | Status: DC | PRN
  Administered 2018-01-09: 80 mg via INTRAVENOUS

## 2018-01-09 MED ORDER — MIDAZOLAM HCL 2 MG/2ML IJ SOLN
INTRAMUSCULAR | Status: AC
  Filled 2018-01-09: qty 2

## 2018-01-09 MED ORDER — DEXAMETHASONE SODIUM PHOSPHATE 4 MG/ML IJ SOLN
INTRAMUSCULAR | Status: DC | PRN
  Administered 2018-01-09: 10 mg via INTRAVENOUS

## 2018-01-09 MED ORDER — LACTATED RINGERS IV SOLN
INTRAVENOUS | Status: DC
  Administered 2018-01-09 (×2): via INTRAVENOUS

## 2018-01-09 MED ORDER — PROMETHAZINE HCL 25 MG RE SUPP: 25.0000 mg | Freq: Four times a day (QID) | RECTAL | 1 refills | Status: AC | PRN

## 2018-01-09 MED ORDER — MIDAZOLAM HCL 2 MG/2ML IJ SOLN
1.0000 mg | INTRAMUSCULAR | Status: DC | PRN
  Administered 2018-01-09: 2 mg via INTRAVENOUS

## 2018-01-09 MED ORDER — SUCCINYLCHOLINE CHLORIDE 20 MG/ML IJ SOLN
INTRAMUSCULAR | Status: DC | PRN
  Administered 2018-01-09: 100 mg via INTRAVENOUS

## 2018-01-09 MED ORDER — LIDOCAINE HCL (CARDIAC) 20 MG/ML IV SOLN
INTRAVENOUS | Status: AC
  Filled 2018-01-09: qty 5

## 2018-01-09 MED ORDER — ONDANSETRON HCL 4 MG/2ML IJ SOLN
INTRAMUSCULAR | Status: AC
  Filled 2018-01-09: qty 2

## 2018-01-09 MED ORDER — LIDOCAINE-EPINEPHRINE 1 %-1:100000 IJ SOLN
INTRAMUSCULAR | Status: DC | PRN
  Administered 2018-01-09: 1.75 mL

## 2018-01-09 MED ORDER — PROMETHAZINE HCL 25 MG/ML IJ SOLN: 6.2500 mg | INTRAMUSCULAR | Status: DC | PRN

## 2018-01-09 MED ORDER — ONDANSETRON HCL 4 MG/2ML IJ SOLN
INTRAMUSCULAR | Status: DC | PRN
  Administered 2018-01-09: 4 mg via INTRAVENOUS

## 2018-01-09 MED ORDER — OXYCODONE HCL 5 MG PO TABS: 5.0000 mg | ORAL_TABLET | Freq: Once | ORAL | Status: DC | PRN

## 2018-01-09 MED ORDER — PROPOFOL 10 MG/ML IV BOLUS
INTRAVENOUS | Status: DC | PRN
  Administered 2018-01-09: 150 mg via INTRAVENOUS

## 2018-01-09 SURGICAL SUPPLY — 42 items
ATTRACTOMAT 16X20 MAGNETIC DRP (DRAPES) ×2 IMPLANT
BLADE RAD40 ROTATE 4M 4 5PK (BLADE) IMPLANT
BLADE RAD60 ROTATE M4 4 5PK (BLADE) IMPLANT
BLADE TRICUT ROTATE M4 4 5PK (BLADE) IMPLANT
BUR HS RAD FRONTAL 3 (BURR) IMPLANT
CANISTER SUC SOCK COL 7IN (MISCELLANEOUS) IMPLANT
CANISTER SUCT 1200ML W/VALVE (MISCELLANEOUS) ×2 IMPLANT
CORD BIPOLAR FORCEPS 12FT (ELECTRODE) IMPLANT
DECANTER SPIKE VIAL GLASS SM (MISCELLANEOUS) IMPLANT
DRESSING ADAPTIC 1/2  N-ADH (PACKING) IMPLANT
DRESSING NASAL KENNEDY 3.5X.9 (MISCELLANEOUS) IMPLANT
DRSG NASAL KENNEDY 3.5X.9 (MISCELLANEOUS)
DRSG NASAL KENNEDY LMNT 8CM (GAUZE/BANDAGES/DRESSINGS) IMPLANT
DRSG NASOPORE 8CM (GAUZE/BANDAGES/DRESSINGS) IMPLANT
ELECT COATED BLADE 2.86 ST (ELECTRODE) IMPLANT
ELECT REM PT RETURN 9FT ADLT (ELECTROSURGICAL) ×2
ELECTRODE REM PT RTRN 9FT ADLT (ELECTROSURGICAL) ×1 IMPLANT
GAUZE SPONGE 4X4 16PLY XRAY LF (GAUZE/BANDAGES/DRESSINGS) IMPLANT
GAUZE VASELINE FOILPK 1/2 X 72 (GAUZE/BANDAGES/DRESSINGS) IMPLANT
GLOVE ECLIPSE 7.5 STRL STRAW (GLOVE) ×2 IMPLANT
GOWN STRL REUS W/ TWL LRG LVL3 (GOWN DISPOSABLE) ×1 IMPLANT
GOWN STRL REUS W/TWL LRG LVL3 (GOWN DISPOSABLE) ×1
HEMOSTAT SURGICEL .5X2 ABSORB (HEMOSTASIS) IMPLANT
IV NS 500ML (IV SOLUTION)
IV NS 500ML BAXH (IV SOLUTION) IMPLANT
NEEDLE PRECISIONGLIDE 27X1.5 (NEEDLE) ×2 IMPLANT
NEEDLE SPNL 25GX3.5 QUINCKE BL (NEEDLE) IMPLANT
NS IRRIG 1000ML POUR BTL (IV SOLUTION) IMPLANT
PACK BASIN DAY SURGERY FS (CUSTOM PROCEDURE TRAY) ×2 IMPLANT
PACK ENT DAY SURGERY (CUSTOM PROCEDURE TRAY) ×2 IMPLANT
PATTIES SURGICAL .5 X3 (DISPOSABLE) ×2 IMPLANT
PENCIL FOOT CONTROL (ELECTRODE) ×2 IMPLANT
PENCIL FOOT CONTROL (ELECTROSURGICAL) IMPLANT
SOLUTION BUTLER CLEAR DIP (MISCELLANEOUS) ×2 IMPLANT
SPONGE GAUZE 2X2 8PLY STRL LF (GAUZE/BANDAGES/DRESSINGS) ×2 IMPLANT
SPONGE SURGIFOAM ABS GEL 12-7 (HEMOSTASIS) IMPLANT
SUT CHROMIC 3 0 SH 27 (SUTURE) ×2 IMPLANT
SUT CHROMIC 4 0 PS 2 18 (SUTURE) IMPLANT
TOWEL OR 17X24 6PK STRL BLUE (TOWEL DISPOSABLE) ×4 IMPLANT
TUBE CONNECTING 20X1/4 (TUBING) IMPLANT
TUBE SALEM SUMP 16 FR W/ARV (TUBING) IMPLANT
YANKAUER SUCT BULB TIP NO VENT (SUCTIONS) ×2 IMPLANT

## 2018-01-09 NOTE — Anesthesia Procedure Notes (Signed)
Procedure Name: Intubation Date/Time: 01/09/2018 11:52 AM Performed by: Maryella Shivers, CRNA Pre-anesthesia Checklist: Patient identified, Emergency Drugs available, Suction available and Patient being monitored Patient Re-evaluated:Patient Re-evaluated prior to induction Oxygen Delivery Method: Circle system utilized Preoxygenation: Pre-oxygenation with 100% oxygen Induction Type: IV induction Ventilation: Mask ventilation without difficulty Laryngoscope Size: Mac and 3 Grade View: Grade I Tube type: Oral Tube size: 7.0 mm Number of attempts: 1 Airway Equipment and Method: Stylet and Oral airway Placement Confirmation: ETT inserted through vocal cords under direct vision,  positive ETCO2 and breath sounds checked- equal and bilateral Secured at: 18 cm Tube secured with: Tape Dental Injury: Teeth and Oropharynx as per pre-operative assessment

## 2018-01-09 NOTE — Interval H&P Note (Signed)
History and Physical Interval Note:  01/09/2018 11:21 AM  Daisy DeistSusan Maria Steele  has presented today for surgery, with the diagnosis of chronic maxiallary sinusitis  The various methods of treatment have been discussed with the patient and family. After consideration of risks, benefits and other options for treatment, the patient has consented to  Procedure(s): SINUSOTOMY VIA CALDWELL LUC RIGHT (Right) as a surgical intervention .  The patient's history has been reviewed, patient examined, no change in status, stable for surgery.  I have reviewed the patient's chart and labs.  Questions were answered to the patient's satisfaction.     Serena ColonelJefry Shaniquia Brafford

## 2018-01-09 NOTE — Op Note (Signed)
OPERATIVE REPORT  DATE OF SURGERY: 01/09/2018  PATIENT:  Daisy DeistSusan Maria Asante,  42 y.o. female  PRE-OPERATIVE DIAGNOSIS:  chronic maxillary sinusitis  POST-OPERATIVE DIAGNOSIS:  chronic maxillary sinusitis  PROCEDURE:  Procedure(s): SINUSOTOMY VIA CALDWELL LUC RIGHT  SURGEON:  Susy FrizzleJefry H Brynlie Daza, MD  ASSISTANTS: None  ANESTHESIA:   General   EBL: 20 ml  DRAINS: None  LOCAL MEDICATIONS USED: 1% Xylocaine with epinephrine  SPECIMEN: Right maxillary sinus sample for culture and sensitivity testing  COUNTS:  Correct  PROCEDURE DETAILS: The patient was taken to the operating room and placed on the operating table in the supine position. Following induction of general endotracheal anesthesia, the face was draped in a standard fashion.  The upper lip was retracted on the right side exposing the canine fossa which was infiltrated with local anesthetic solution.  Electrocautery was used to incise the mucosa and through submucosal tissue down to the maxillary periosteum.  The previous surgical site was identified and the sinus was entered easily.  Expose the opening more thoroughly and a Kerrison ronjeur was used to enlarge it superiorly.  Upon entering the sinus thick purulent material was obtained.  Samples were sent for aerobic and anaerobic culture and sensitivity testing.  The 0 and 30 degree endoscope was used to inspect the maxillary sinus.  A curved suction was used to open the infected cavity completely.  There is no residual pathologic appearing tissue.  The inferior aspect of the sinus was widely patent at this point.  The sinus was irrigated with saline and suctioned.  The incision was reapproximated with running 3-0 chromic suture.  Patient was awakened extubated and transferred to recovery in stable condition.    PATIENT DISPOSITION:  To PACU, stable

## 2018-01-09 NOTE — Anesthesia Preprocedure Evaluation (Signed)
Anesthesia Evaluation  Patient identified by MRN, date of birth, ID band Patient awake    Reviewed: Allergy & Precautions, NPO status , Patient's Chart, lab work & pertinent test results  Airway Mallampati: II  TM Distance: >3 FB Neck ROM: Full    Dental no notable dental hx.    Pulmonary neg pulmonary ROS, asthma ,    Pulmonary exam normal breath sounds clear to auscultation       Cardiovascular negative cardio ROS Normal cardiovascular exam Rhythm:Regular Rate:Normal     Neuro/Psych  Headaches, negative neurological ROS  negative psych ROS   GI/Hepatic negative GI ROS, Neg liver ROS,   Endo/Other  negative endocrine ROS  Renal/GU negative Renal ROS  negative genitourinary   Musculoskeletal negative musculoskeletal ROS (+)   Abdominal   Peds negative pediatric ROS (+)  Hematology negative hematology ROS (+)   Anesthesia Other Findings   Reproductive/Obstetrics negative OB ROS                             Anesthesia Physical Anesthesia Plan  ASA: II  Anesthesia Plan: General   Post-op Pain Management:    Induction: Intravenous  PONV Risk Score and Plan: 3 and Ondansetron, Dexamethasone and Midazolam  Airway Management Planned: Oral ETT  Additional Equipment:   Intra-op Plan:   Post-operative Plan: Extubation in OR  Informed Consent: I have reviewed the patients History and Physical, chart, labs and discussed the procedure including the risks, benefits and alternatives for the proposed anesthesia with the patient or authorized representative who has indicated his/her understanding and acceptance.   Dental advisory given  Plan Discussed with: CRNA  Anesthesia Plan Comments:         Anesthesia Quick Evaluation

## 2018-01-09 NOTE — Discharge Instructions (Signed)
Apply ice to the right side of the face several times daily for 2 days.  Keep your head elevated above the level of your heart at all times for 1 week.     Post Anesthesia Home Care Instructions  Activity: Get plenty of rest for the remainder of the day. A responsible individual must stay with you for 24 hours following the procedure.  For the next 24 hours, DO NOT: -Drive a car -Advertising copywriterperate machinery -Drink alcoholic beverages -Take any medication unless instructed by your physician -Make any legal decisions or sign important papers.  Meals: Start with liquid foods such as gelatin or soup. Progress to regular foods as tolerated. Avoid greasy, spicy, heavy foods. If nausea and/or vomiting occur, drink only clear liquids until the nausea and/or vomiting subsides. Call your physician if vomiting continues.  Special Instructions/Symptoms: Your throat may feel dry or sore from the anesthesia or the breathing tube placed in your throat during surgery. If this causes discomfort, gargle with warm salt water. The discomfort should disappear within 24 hours.  If you had a scopolamine patch placed behind your ear for the management of post- operative nausea and/or vomiting:  1. The medication in the patch is effective for 72 hours, after which it should be removed.  Wrap patch in a tissue and discard in the trash. Wash hands thoroughly with soap and water. 2. You may remove the patch earlier than 72 hours if you experience unpleasant side effects which may include dry mouth, dizziness or visual disturbances. 3. Avoid touching the patch. Wash your hands with soap and water after contact with the patch.

## 2018-01-09 NOTE — Transfer of Care (Signed)
Immediate Anesthesia Transfer of Care Note  Patient: Daisy LennertSusan Maria Steele  Procedure(s) Performed: SINUSOTOMY VIA CALDWELL LUC RIGHT (Right Mouth)  Patient Location: PACU  Anesthesia Type:General  Level of Consciousness: sedated  Airway & Oxygen Therapy: Patient Spontanous Breathing and Patient connected to face mask oxygen  Post-op Assessment: Report given to RN and Post -op Vital signs reviewed and stable  Post vital signs: Reviewed and stable  Last Vitals:  Vitals Value Taken Time  BP 134/79 01/09/2018 12:26 PM  Temp    Pulse 88 01/09/2018 12:28 PM  Resp 15 01/09/2018 12:28 PM  SpO2 99 % 01/09/2018 12:28 PM  Vitals shown include unvalidated device data.  Last Pain:  Vitals:   01/09/18 1025  TempSrc: Oral  PainSc: 0-No pain         Complications: No apparent anesthesia complications

## 2018-01-09 NOTE — Anesthesia Postprocedure Evaluation (Signed)
Anesthesia Post Note  Patient: Daisy Steele  Procedure(s) Performed: SINUSOTOMY VIA CALDWELL LUC RIGHT (Right Mouth)     Patient location during evaluation: PACU Anesthesia Type: General Level of consciousness: awake and alert Pain management: pain level controlled Vital Signs Assessment: post-procedure vital signs reviewed and stable Respiratory status: spontaneous breathing, nonlabored ventilation and respiratory function stable Cardiovascular status: blood pressure returned to baseline and stable Postop Assessment: no apparent nausea or vomiting Anesthetic complications: no    Last Vitals:  Vitals:   01/09/18 1245 01/09/18 1300  BP: 121/79 124/82  Pulse: 86 77  Resp: 19 16  Temp:    SpO2: 99% 100%    Last Pain:  Vitals:   01/09/18 1300  TempSrc:   PainSc: 2                  Lowella CurbWarren Ray Mckay Tegtmeyer

## 2018-01-10 ENCOUNTER — Encounter (HOSPITAL_BASED_OUTPATIENT_CLINIC_OR_DEPARTMENT_OTHER): Payer: Self-pay | Admitting: Otolaryngology

## 2018-01-17 LAB — AEROBIC/ANAEROBIC CULTURE (SURGICAL/DEEP WOUND)

## 2018-01-17 LAB — AEROBIC/ANAEROBIC CULTURE W GRAM STAIN (SURGICAL/DEEP WOUND)

## 2018-01-27 ENCOUNTER — Ambulatory Visit
Admission: RE | Admit: 2018-01-27 | Discharge: 2018-01-27 | Disposition: A | Discharge status: Home / Self Care | Payer: Federal, State, Local not specified - PPO | Source: Ambulatory Visit | Attending: Family Medicine | Admitting: Family Medicine

## 2018-01-27 DIAGNOSIS — Z1231 Encounter for screening mammogram for malignant neoplasm of breast: Secondary | ICD-10-CM | POA: Diagnosis not present

## 2018-01-30 DIAGNOSIS — K080 Exfoliation of teeth due to systemic causes: Secondary | ICD-10-CM | POA: Diagnosis not present

## 2018-02-27 DIAGNOSIS — K080 Exfoliation of teeth due to systemic causes: Secondary | ICD-10-CM | POA: Diagnosis not present

## 2018-03-23 DIAGNOSIS — K080 Exfoliation of teeth due to systemic causes: Secondary | ICD-10-CM | POA: Diagnosis not present

## 2018-06-30 DIAGNOSIS — J019 Acute sinusitis, unspecified: Secondary | ICD-10-CM | POA: Diagnosis not present

## 2018-08-16 DIAGNOSIS — L821 Other seborrheic keratosis: Secondary | ICD-10-CM | POA: Diagnosis not present

## 2018-08-16 DIAGNOSIS — D2272 Melanocytic nevi of left lower limb, including hip: Secondary | ICD-10-CM | POA: Diagnosis not present

## 2018-08-16 DIAGNOSIS — D224 Melanocytic nevi of scalp and neck: Secondary | ICD-10-CM | POA: Diagnosis not present

## 2018-08-16 DIAGNOSIS — D2262 Melanocytic nevi of left upper limb, including shoulder: Secondary | ICD-10-CM | POA: Diagnosis not present

## 2019-04-02 ENCOUNTER — Other Ambulatory Visit: Payer: Self-pay | Admitting: Family Medicine

## 2019-04-02 DIAGNOSIS — Z1231 Encounter for screening mammogram for malignant neoplasm of breast: Secondary | ICD-10-CM

## 2019-05-16 ENCOUNTER — Other Ambulatory Visit: Payer: Self-pay

## 2019-05-16 ENCOUNTER — Ambulatory Visit
Admission: RE | Admit: 2019-05-16 | Discharge: 2019-05-16 | Disposition: A | Discharge status: Home / Self Care | Payer: Federal, State, Local not specified - PPO | Source: Ambulatory Visit | Attending: Family Medicine | Admitting: Family Medicine

## 2019-05-16 DIAGNOSIS — Z1231 Encounter for screening mammogram for malignant neoplasm of breast: Secondary | ICD-10-CM

## 2019-08-22 DIAGNOSIS — D2272 Melanocytic nevi of left lower limb, including hip: Secondary | ICD-10-CM | POA: Diagnosis not present

## 2019-08-22 DIAGNOSIS — L821 Other seborrheic keratosis: Secondary | ICD-10-CM | POA: Diagnosis not present

## 2019-08-22 DIAGNOSIS — D2262 Melanocytic nevi of left upper limb, including shoulder: Secondary | ICD-10-CM | POA: Diagnosis not present

## 2019-08-22 DIAGNOSIS — D224 Melanocytic nevi of scalp and neck: Secondary | ICD-10-CM | POA: Diagnosis not present

## 2019-09-19 DIAGNOSIS — U071 COVID-19: Secondary | ICD-10-CM | POA: Diagnosis not present
